# Patient Record
Sex: Female | Born: 1965 | Hispanic: Yes | Marital: Married | Smoking: Never smoker
Health system: Southern US, Community
[De-identification: ages and names within clinical notes are randomized; demographics above are authoritative.]

---

## 2012-11-28 ENCOUNTER — Encounter (HOSPITAL_COMMUNITY)
Admission: EM | Disposition: A | Discharge status: Home / Self Care | Payer: Self-pay | Source: Home / Self Care | Attending: Orthopedic Surgery

## 2012-11-28 ENCOUNTER — Emergency Department (HOSPITAL_COMMUNITY): Payer: Self-pay

## 2012-11-28 ENCOUNTER — Encounter (HOSPITAL_COMMUNITY): Payer: Self-pay | Admitting: *Deleted

## 2012-11-28 ENCOUNTER — Inpatient Hospital Stay (HOSPITAL_COMMUNITY): Payer: Self-pay | Admitting: Anesthesiology

## 2012-11-28 ENCOUNTER — Encounter (HOSPITAL_COMMUNITY): Payer: Self-pay | Admitting: Anesthesiology

## 2012-11-28 ENCOUNTER — Observation Stay (HOSPITAL_COMMUNITY)
Admission: EM | Admit: 2012-11-28 | Discharge: 2012-11-29 | Disposition: A | Discharge status: Home / Self Care | Payer: MEDICAID | Attending: Orthopedic Surgery | Admitting: Orthopedic Surgery

## 2012-11-28 DIAGNOSIS — W010XXA Fall on same level from slipping, tripping and stumbling without subsequent striking against object, initial encounter: Secondary | ICD-10-CM | POA: Insufficient documentation

## 2012-11-28 DIAGNOSIS — M25429 Effusion, unspecified elbow: Secondary | ICD-10-CM | POA: Insufficient documentation

## 2012-11-28 DIAGNOSIS — F329 Major depressive disorder, single episode, unspecified: Secondary | ICD-10-CM

## 2012-11-28 DIAGNOSIS — F10920 Alcohol use, unspecified with intoxication, uncomplicated: Secondary | ICD-10-CM

## 2012-11-28 DIAGNOSIS — S52133A Displaced fracture of neck of unspecified radius, initial encounter for closed fracture: Secondary | ICD-10-CM | POA: Insufficient documentation

## 2012-11-28 DIAGNOSIS — S42402A Unspecified fracture of lower end of left humerus, initial encounter for closed fracture: Secondary | ICD-10-CM

## 2012-11-28 DIAGNOSIS — F419 Anxiety disorder, unspecified: Secondary | ICD-10-CM

## 2012-11-28 DIAGNOSIS — F1010 Alcohol abuse, uncomplicated: Secondary | ICD-10-CM | POA: Insufficient documentation

## 2012-11-28 DIAGNOSIS — S52043A Displaced fracture of coronoid process of unspecified ulna, initial encounter for closed fracture: Secondary | ICD-10-CM | POA: Insufficient documentation

## 2012-11-28 DIAGNOSIS — R110 Nausea: Secondary | ICD-10-CM | POA: Insufficient documentation

## 2012-11-28 DIAGNOSIS — S53126A Posterior dislocation of unspecified ulnohumeral joint, initial encounter: Principal | ICD-10-CM | POA: Insufficient documentation

## 2012-11-28 HISTORY — PX: TOTAL ELBOW ARTHROPLASTY: SHX812

## 2012-11-28 LAB — CBC WITH DIFFERENTIAL/PLATELET
Basophils Relative: 0 % (ref 0–1)
Eosinophils Absolute: 0 10*3/uL (ref 0.0–0.7)
Eosinophils Relative: 0 % (ref 0–5)
HCT: 37 % (ref 36.0–46.0)
Hemoglobin: 12.8 g/dL (ref 12.0–15.0)
Lymphs Abs: 1.3 10*3/uL (ref 0.7–4.0)
MCH: 31.3 pg (ref 26.0–34.0)
MCHC: 34.6 g/dL (ref 30.0–36.0)
MCV: 90.5 fL (ref 78.0–100.0)
Monocytes Absolute: 0.3 10*3/uL (ref 0.1–1.0)
Monocytes Relative: 3 % (ref 3–12)
Neutrophils Relative %: 82 % — ABNORMAL HIGH (ref 43–77)
RBC: 4.09 MIL/uL (ref 3.87–5.11)

## 2012-11-28 LAB — BASIC METABOLIC PANEL
BUN: 10 mg/dL (ref 6–23)
Creatinine, Ser: 0.43 mg/dL — ABNORMAL LOW (ref 0.50–1.10)
GFR calc non Af Amer: >90 mL/min (ref >90)
Glucose, Bld: 125 mg/dL — ABNORMAL HIGH (ref 70–99)
Potassium: 3.6 mEq/L (ref 3.5–5.1)

## 2012-11-28 SURGERY — ARTHROPLASTY, ELBOW, TOTAL
Anesthesia: General | Site: Elbow | Laterality: Left | Wound class: Clean

## 2012-11-28 MED ORDER — KETAMINE HCL 10 MG/ML IJ SOLN
INTRAMUSCULAR | Status: DC | PRN
  Administered 2012-11-28: 20 mg via INTRAVENOUS

## 2012-11-28 MED ORDER — ACETAMINOPHEN 10 MG/ML IV SOLN
INTRAVENOUS | Status: AC
  Filled 2012-11-28: qty 100

## 2012-11-28 MED ORDER — ONDANSETRON HCL 4 MG/2ML IJ SOLN
INTRAMUSCULAR | Status: DC | PRN
  Administered 2012-11-28: 4 mg via INTRAVENOUS

## 2012-11-28 MED ORDER — MIDAZOLAM HCL 5 MG/5ML IJ SOLN
INTRAMUSCULAR | Status: DC | PRN
  Administered 2012-11-28: 2 mg via INTRAVENOUS

## 2012-11-28 MED ORDER — DEXAMETHASONE SODIUM PHOSPHATE 10 MG/ML IJ SOLN
INTRAMUSCULAR | Status: DC | PRN
  Administered 2012-11-28: 10 mg via INTRAVENOUS

## 2012-11-28 MED ORDER — BACITRACIN ZINC 500 UNIT/GM EX OINT
TOPICAL_OINTMENT | CUTANEOUS | Status: AC
  Filled 2012-11-28: qty 15

## 2012-11-28 MED ORDER — ONDANSETRON HCL 4 MG PO TABS: 4.0000 mg | ORAL_TABLET | Freq: Four times a day (QID) | ORAL | Status: DC | PRN

## 2012-11-28 MED ORDER — CEFAZOLIN SODIUM-DEXTROSE 2-3 GM-% IV SOLR
INTRAVENOUS | Status: DC | PRN
  Administered 2012-11-28: 2 g via INTRAVENOUS

## 2012-11-28 MED ORDER — FENTANYL CITRATE 0.05 MG/ML IJ SOLN
50.0000 ug | Freq: Once | INTRAMUSCULAR | Status: AC
Start: 2012-11-28 — End: 2012-11-28
  Administered 2012-11-28: 50 ug via INTRAVENOUS
  Filled 2012-11-28: qty 2

## 2012-11-28 MED ORDER — ROCURONIUM BROMIDE 100 MG/10ML IV SOLN
INTRAVENOUS | Status: DC | PRN
  Administered 2012-11-28: 30 mg via INTRAVENOUS

## 2012-11-28 MED ORDER — LACTATED RINGERS IV SOLN
INTRAVENOUS | Status: DC
  Administered 2012-11-28 – 2012-11-29 (×3): via INTRAVENOUS

## 2012-11-28 MED ORDER — SODIUM CHLORIDE 0.9 % IR SOLN
Status: DC | PRN
  Administered 2012-11-28: 10:00:00

## 2012-11-28 MED ORDER — ACETAMINOPHEN 10 MG/ML IV SOLN
INTRAVENOUS | Status: DC | PRN
  Administered 2012-11-28: 1000 mg via INTRAVENOUS

## 2012-11-28 MED ORDER — CEFAZOLIN SODIUM 1-5 GM-% IV SOLN
1.0000 g | Freq: Three times a day (TID) | INTRAVENOUS | Status: DC
  Administered 2012-11-28 – 2012-11-29 (×3): 1 g via INTRAVENOUS
  Filled 2012-11-28 (×4): qty 50

## 2012-11-28 MED ORDER — BUPIVACAINE HCL (PF) 0.5 % IJ SOLN
INTRAMUSCULAR | Status: DC | PRN
  Administered 2012-11-28: 20 mL

## 2012-11-28 MED ORDER — SUCCINYLCHOLINE CHLORIDE 20 MG/ML IJ SOLN
INTRAMUSCULAR | Status: DC | PRN
  Administered 2012-11-28: 100 mg via INTRAVENOUS

## 2012-11-28 MED ORDER — PROPOFOL 10 MG/ML IV BOLUS
INTRAVENOUS | Status: DC | PRN
  Administered 2012-11-28: 150 mg via INTRAVENOUS

## 2012-11-28 MED ORDER — PHENYLEPHRINE HCL 10 MG/ML IJ SOLN
INTRAMUSCULAR | Status: DC | PRN
  Administered 2012-11-28: 80 ug via INTRAVENOUS

## 2012-11-28 MED ORDER — FAMOTIDINE 20 MG PO TABS
20.0000 mg | ORAL_TABLET | Freq: Two times a day (BID) | ORAL | Status: DC | PRN
  Filled 2012-11-28: qty 1

## 2012-11-28 MED ORDER — PROMETHAZINE HCL 25 MG RE SUPP: 12.5000 mg | Freq: Four times a day (QID) | RECTAL | Status: DC | PRN

## 2012-11-28 MED ORDER — ALPRAZOLAM 0.5 MG PO TABS: 0.5000 mg | ORAL_TABLET | Freq: Four times a day (QID) | ORAL | Status: DC | PRN

## 2012-11-28 MED ORDER — 0.9 % SODIUM CHLORIDE (POUR BTL) OPTIME
TOPICAL | Status: DC | PRN
  Administered 2012-11-28: 2000 mL

## 2012-11-28 MED ORDER — MORPHINE SULFATE 2 MG/ML IJ SOLN
1.0000 mg | INTRAMUSCULAR | Status: DC | PRN
  Administered 2012-11-28 – 2012-11-29 (×2): 1 mg via INTRAVENOUS
  Filled 2012-11-28 (×2): qty 1

## 2012-11-28 MED ORDER — LIDOCAINE HCL (CARDIAC) 20 MG/ML IV SOLN
INTRAVENOUS | Status: DC | PRN
  Administered 2012-11-28: 100 mg via INTRAVENOUS

## 2012-11-28 MED ORDER — HYDROMORPHONE HCL PF 1 MG/ML IJ SOLN: 0.2500 mg | INTRAMUSCULAR | Status: DC | PRN

## 2012-11-28 MED ORDER — VITAMIN C 500 MG PO TABS
1000.0000 mg | ORAL_TABLET | Freq: Every day | ORAL | Status: DC
  Administered 2012-11-28 – 2012-11-29 (×2): 1000 mg via ORAL
  Filled 2012-11-28 (×2): qty 2

## 2012-11-28 MED ORDER — OXYCODONE HCL 5 MG PO TABS
5.0000 mg | ORAL_TABLET | ORAL | Status: DC | PRN
  Filled 2012-11-28: qty 1

## 2012-11-28 MED ORDER — BUPIVACAINE HCL (PF) 0.5 % IJ SOLN
INTRAMUSCULAR | Status: AC
Start: 2012-11-28 — End: 2012-11-28
  Filled 2012-11-28: qty 30

## 2012-11-28 MED ORDER — ETOMIDATE 2 MG/ML IV SOLN
20.0000 mg | Freq: Once | INTRAVENOUS | Status: AC
  Administered 2012-11-28: 10 mg via INTRAVENOUS
  Filled 2012-11-28: qty 10

## 2012-11-28 MED ORDER — HYDROMORPHONE HCL PF 1 MG/ML IJ SOLN
INTRAMUSCULAR | Status: DC | PRN
  Administered 2012-11-28: 1 mg via INTRAVENOUS

## 2012-11-28 MED ORDER — FENTANYL CITRATE 0.05 MG/ML IJ SOLN
INTRAMUSCULAR | Status: DC | PRN
  Administered 2012-11-28 (×5): 50 ug via INTRAVENOUS

## 2012-11-28 MED ORDER — ONDANSETRON HCL 4 MG/2ML IJ SOLN
4.0000 mg | Freq: Four times a day (QID) | INTRAMUSCULAR | Status: DC | PRN
  Administered 2012-11-28: 4 mg via INTRAVENOUS
  Filled 2012-11-28: qty 2

## 2012-11-28 MED ORDER — MORPHINE SULFATE 2 MG/ML IJ SOLN
2.0000 mg | INTRAMUSCULAR | Status: DC | PRN
  Administered 2012-11-28: 2 mg via INTRAVENOUS
  Filled 2012-11-28: qty 1

## 2012-11-28 MED ORDER — CEFAZOLIN SODIUM-DEXTROSE 2-3 GM-% IV SOLR
INTRAVENOUS | Status: AC
  Filled 2012-11-28: qty 50

## 2012-11-28 MED ORDER — SODIUM CHLORIDE 0.9 % IV SOLN
Freq: Once | INTRAVENOUS | Status: AC
  Administered 2012-11-28: 04:00:00 via INTRAVENOUS

## 2012-11-28 MED ORDER — NEOSTIGMINE METHYLSULFATE 1 MG/ML IJ SOLN
INTRAMUSCULAR | Status: DC | PRN
  Administered 2012-11-28: 5 mg via INTRAVENOUS

## 2012-11-28 MED ORDER — DOCUSATE SODIUM 100 MG PO CAPS
100.0000 mg | ORAL_CAPSULE | Freq: Two times a day (BID) | ORAL | Status: DC
  Administered 2012-11-28 – 2012-11-29 (×2): 100 mg via ORAL
  Filled 2012-11-28 (×3): qty 1

## 2012-11-28 MED ORDER — GLYCOPYRROLATE 0.2 MG/ML IJ SOLN
INTRAMUSCULAR | Status: DC | PRN
  Administered 2012-11-28: .8 mg via INTRAVENOUS

## 2012-11-28 MED ORDER — FENTANYL CITRATE 0.05 MG/ML IJ SOLN
50.0000 ug | Freq: Once | INTRAMUSCULAR | Status: AC
  Administered 2012-11-28: 50 ug via INTRAVENOUS

## 2012-11-28 MED ORDER — LACTATED RINGERS IV SOLN
INTRAVENOUS | Status: DC
  Administered 2012-11-28: 09:00:00 via INTRAVENOUS

## 2012-11-28 SURGICAL SUPPLY — 42 items
ANCHOR JUGGERKNOT SHT 1.4 SOFT (Anchor) ×4 IMPLANT
BAG ZIPLOCK 12X15 (MISCELLANEOUS) ×2 IMPLANT
BANDAGE ELASTIC 4 VELCRO ST LF (GAUZE/BANDAGES/DRESSINGS) ×2 IMPLANT
BLADE OSCILLATING/SAGITTAL (BLADE) ×1
BLADE SAW SAG 73X25 THK (BLADE) ×1
BLADE SAW SGTL 73X25 THK (BLADE) ×1 IMPLANT
BLADE SW THK.38XMED LNG THN (BLADE) ×1 IMPLANT
BNDG ESMARK 4X9 LF (GAUZE/BANDAGES/DRESSINGS) ×2 IMPLANT
CLOTH BEACON ORANGE TIMEOUT ST (SAFETY) ×2 IMPLANT
CUFF TOURN SGL QUICK 18 (TOURNIQUET CUFF) ×2 IMPLANT
DRAPE U-SHAPE 47X51 STRL (DRAPES) ×4 IMPLANT
DRSG EMULSION OIL 3X16 NADH (GAUZE/BANDAGES/DRESSINGS) ×2 IMPLANT
DURAPREP 26ML APPLICATOR (WOUND CARE) IMPLANT
ELECT REM PT RETURN 9FT ADLT (ELECTROSURGICAL) ×2
ELECTRODE REM PT RTRN 9FT ADLT (ELECTROSURGICAL) ×1 IMPLANT
GAUZE XEROFORM 5X9 LF (GAUZE/BANDAGES/DRESSINGS) ×2 IMPLANT
GLOVE BIO SURGEON STRL SZ8 (GLOVE) ×2 IMPLANT
GOWN STRL REIN XL XLG (GOWN DISPOSABLE) ×2 IMPLANT
HEAD RADIAL 10X20 (Head) ×2 IMPLANT
MANIFOLD NEPTUNE II (INSTRUMENTS) ×2 IMPLANT
NS IRRIG 1000ML POUR BTL (IV SOLUTION) ×4 IMPLANT
PACK SHOULDER CUSTOM OPM052 (CUSTOM PROCEDURE TRAY) ×2 IMPLANT
PAD CAST 4YDX4 CTTN HI CHSV (CAST SUPPLIES) ×1 IMPLANT
PADDING CAST COTTON 4X4 STRL (CAST SUPPLIES) ×1
PASSER SUT SWANSON 36MM LOOP (INSTRUMENTS) ×2 IMPLANT
POSITIONER SURGICAL ARM (MISCELLANEOUS) ×2 IMPLANT
SPONGE GAUZE 4X4 12PLY (GAUZE/BANDAGES/DRESSINGS) ×2 IMPLANT
SPONGE LAP 4X18 X RAY DECT (DISPOSABLE) IMPLANT
STEM IMPLANT W SCREW (Stem) ×2 IMPLANT
SUT FIBERWIRE #2 38 T-5 BLUE (SUTURE)
SUT PROLENE 4 0 PS 2 18 (SUTURE) ×2 IMPLANT
SUT VIC AB 0 CT1 27 (SUTURE) ×1
SUT VIC AB 0 CT1 27XBRD ANTBC (SUTURE) ×1 IMPLANT
SUT VIC AB 1 CT1 27 (SUTURE) ×1
SUT VIC AB 1 CT1 27XBRD ANTBC (SUTURE) ×1 IMPLANT
SUT VIC AB 2-0 CT2 27 (SUTURE) ×2 IMPLANT
SUT VIC AB 3-0 PS2 18 (SUTURE) ×1
SUT VIC AB 3-0 PS2 18XBRD (SUTURE) ×1 IMPLANT
SUTURE FIBERWR #2 38 T-5 BLUE (SUTURE) IMPLANT
TOWEL OR 17X26 10 PK STRL BLUE (TOWEL DISPOSABLE) ×2 IMPLANT
TOWER CARTRIDGE SMART MIX (DISPOSABLE) IMPLANT
WATER STERILE IRR 1500ML POUR (IV SOLUTION) IMPLANT

## 2012-11-28 NOTE — ED Notes (Signed)
Pt slipped and fell on left side, pain on left arm particularly on left elbow, able to move shower but holding on the wrist and elbow. Limited ROM, redness and swelling noted on left elbow. Xray ordered. Pt stable at the moment.

## 2012-11-28 NOTE — H&P (Signed)
Jacqueline Lawson is an 47 y.o. female.   Chief Complaint: Fracture dislocation left elbow HPI: Pleasant Hispanic female with a fracture dislocation to her elbow. She was preliminarily seen and reduced by emergency staff Dr. Freida Busman. Subsequent to this x-rays do show the fracture and continued subluxation of the joint to a certain extent. She has a lateral ulna collateral ligament injury with associated displaced radial head fracture including and neck. Unfortunately this is a highly unstable situation. The patient I discussed these issues at length. She has been n.p.o. She notes no other complaints. She is here with her husband. She fell on an outstretched extremity due to the slippery floor she states. The patient is quite pleasant. She denies neck back chest or nominal pain. She is alert and oriented x4.  History reviewed. No pertinent past medical history.  History reviewed. No pertinent past surgical history.  No family history on file. Social History:  reports that she has never smoked. She does not have any smokeless tobacco history on file. She reports that she does not drink alcohol or use illicit drugs.  Allergies: No Known Allergies   (Not in a hospital admission)  Results for orders placed during the hospital encounter of 11/28/12 (from the past 48 hour(s))  CBC WITH DIFFERENTIAL     Status: Abnormal   Collection Time    11/28/12  6:37 AM      Result Value Range   WBC 8.5  4.0 - 10.5 K/uL   RBC 4.09  3.87 - 5.11 MIL/uL   Hemoglobin 12.8  12.0 - 15.0 g/dL   HCT 16.1  09.6 - 04.5 %   MCV 90.5  78.0 - 100.0 fL   MCH 31.3  26.0 - 34.0 pg   MCHC 34.6  30.0 - 36.0 g/dL   RDW 40.9  81.1 - 91.4 %   Platelets 223  150 - 400 K/uL   Neutrophils Relative % 82 (*) 43 - 77 %   Neutro Abs 7.0  1.7 - 7.7 K/uL   Lymphocytes Relative 15  12 - 46 %   Lymphs Abs 1.3  0.7 - 4.0 K/uL   Monocytes Relative 3  3 - 12 %   Monocytes Absolute 0.3  0.1 - 1.0 K/uL   Eosinophils Relative 0   0 - 5 %   Eosinophils Absolute 0.0  0.0 - 0.7 K/uL   Basophils Relative 0  0 - 1 %   Basophils Absolute 0.0  0.0 - 0.1 K/uL  BASIC METABOLIC PANEL     Status: Abnormal   Collection Time    11/28/12  6:37 AM      Result Value Range   Sodium 136  135 - 145 mEq/L   Potassium 3.6  3.5 - 5.1 mEq/L   Chloride 104  96 - 112 mEq/L   CO2 23  19 - 32 mEq/L   Glucose, Bld 125 (*) 70 - 99 mg/dL   BUN 10  6 - 23 mg/dL   Creatinine, Ser 7.82 (*) 0.50 - 1.10 mg/dL   Calcium 8.8  8.4 - 95.6 mg/dL   GFR calc non Af Amer >90  >90 mL/min   GFR calc Af Amer >90  >90 mL/min   Comment:            The eGFR has been calculated     using the CKD EPI equation.     This calculation has not been     validated in all clinical  situations.     eGFR's persistently     <90 mL/min signify     possible Chronic Kidney Disease.   Dg Chest 2 View  11/28/2012   *RADIOLOGY REPORT*  Clinical Data: Preoperative evaluation for elbow surgery  CHEST - 2 VIEW  Comparison: None  Findings: Normal heart size, mediastinal contours, and pulmonary vascularity. Lungs clear. No pleural effusion or pneumothorax. No acute osseous findings.  IMPRESSION: No acute abnormalities.   Original Report Authenticated By: Ulyses Southward, M.D.   Dg Elbow 2 Views Left  11/28/2012   *RADIOLOGY REPORT*  Clinical Data:  Elbow dislocation, post reduction  LEFT ELBOW - 2 VIEW  Comparison: 11/28/2012  Findings: Trochlear joint dislocation has been reduced with normal alignment of ulna and humerus now identified. Displaced radial neck fracture is now identified. Radial head fragment is dislocated from the radiocapitellar joint. Bone fragments are seen anterior to the elbow joint. Large elbow joint effusion is seen. Regional soft tissue swelling.  IMPRESSION: Reduction of previously identified ulnar dislocation with normal alignment of the trochlear joint now identified. Displaced radial neck fracture with dislocation of the radial head from the  radiocapitellar joint. This radial neck fracture is not well visualized on the earlier radiographs.   Original Report Authenticated By: Ulyses Southward, M.D.   Dg Elbow Complete Left  11/28/2012   *RADIOLOGY REPORT*  Clinical Data: Left elbow pain and limited mobility post fall  LEFT ELBOW - COMPLETE 3+ VIEW  Comparison: None  Findings: Posterior/dorsal dislocation of the left elbow. Multiple small fracture fragments are seen at the trochlea, of uncertain origin. Associated soft tissue swelling and deformity as well as questionable elbow joint effusion.  IMPRESSION: Posterior/dorsal elbow dislocation with a few linear fracture fragments at the trochlea of uncertain origin.   Original Report Authenticated By: Ulyses Southward, M.D.    Review of Systems  Constitutional: Negative.   HENT: Negative.   Eyes: Negative.   Cardiovascular: Negative.   Gastrointestinal: Negative.   Genitourinary: Negative.   Musculoskeletal:       Fracture dislocation left elbow see physical  Skin: Negative.   Neurological: Negative.   Endo/Heme/Allergies: Negative.   Psychiatric/Behavioral: Negative.     Blood pressure 120/87, pulse 109, temperature 98.2 F (36.8 C), temperature source Oral, resp. rate 15, SpO2 100.00%. Physical Exam patient has a left elbow is swollen she has a fracture dislocation. She notes no numbness in the hand but states that the arm in general is sore her extensor pollicis longus fires well. The patient has no evidence of infection or compartment syndrome. Shoulder and wrist are nontender. She notes no instability symptoms in her wrist however examination for [place] injury is difficult due to her pain . She does not have any open wounds at present time.  Her husband is at her bedside they're both awake alert and oriented. The patient notes no neck back chest or abdominal pain.  [name].The patient is alert and oriented in no acute distress the patient complains of pain in the affected upper  extremity.  The patient is noted to have a normal HEENT exam.  Lung fields show equal chest expansion and no shortness of breath  abdomen exam is nontender without distention.  Lower extremity examination does not show any fracture dislocation or blood clot symptoms.  Pelvis is stable neck and back are stable and nontender  Assessment/Plan Fracture dislocation left elbow. Given the displacement of the radial neck/head and its instability I would recommend ORIF versus radial head arthroplasty with  repair reconstruction of the lateral ulna humeral ligament as necessary. I discussed with the patient risk and benefits. I discussed with them likely stiffness as well as prolonged recovery. Typically our goal is to provide a stable elbow with prevention of further arthritis developing from a traumatic injury such as this. The patient and her family understand this and risk and benefits and desires to proceed with the above mentioned operative intervention as soon as possible. I discussed with the patient relevant issues in terms of her extremity predicament and in terms of her postop recovery process.  I discussed with the emergency room staff my findings and plans. We will proceed to the operative arena as soon as possible.  [name].We are planning surgery for your upper extremity. The risk and benefits of surgery include risk of bleeding infection anesthesia damage to normal structures and failure of the surgery to accomplish its intended goals of relieving symptoms and restoring function with this in mind we'll going to proceed. I have specifically discussed with the patient the pre-and postoperative regime and the does and don'ts and risk and benefits in great detail. Risk and benefits of surgery also include risk of dystrophy chronic nerve pain failure of the healing process to go onto completion and other inherent risks of surgery The relavent the pathophysiology of the disease/injury process, as well as the  alternatives for treatment and postoperative course of action has been discussed in great detail with the patient who desires to proceed.  We will do everything in our power to help you (the patient) restore function to the upper extremity. Is a pleasure to see this patient today.   Karen Chafe 11/28/2012, 7:20 AM

## 2012-11-28 NOTE — ED Notes (Signed)
Pt moaning in pain,  Verbal order by Dondra Spry NP  To give Fentanyl IV

## 2012-11-28 NOTE — Transfer of Care (Signed)
Immediate Anesthesia Transfer of Care Note  Patient: Jacqueline Lawson  Procedure(s) Performed: Procedure(s) (LRB): radial head excision open treatment coronoid fracture ulna left elbow (Left) lateral ulna collateral lateral ligament repair (Left)  Patient Location: PACU  Anesthesia Type: General  Level of Consciousness: sedated, patient cooperative and responds to stimulaton  Airway & Oxygen Therapy: Patient Spontanous Breathing and Patient connected to face mask oxgen  Post-op Assessment: Report given to PACU RN and Post -op Vital signs reviewed and stable  Post vital signs: Reviewed and stable  Complications: No apparent anesthesia complications

## 2012-11-28 NOTE — Progress Notes (Signed)
Pt vitals stable post op. Pt states pain is a 5/10 but declines pain medication at this time. Will continue to monitor, family at bedside. Pt educated to move fingers often. Left fingers warm to touch.

## 2012-11-28 NOTE — ED Notes (Signed)
Pt's daughter reports pt slipped on a wet floor, attempted to catch her fall with her L hand.  Reports L elbow pain.  Obvious deformity noted.  Denies hitting her head

## 2012-11-28 NOTE — Anesthesia Postprocedure Evaluation (Addendum)
  Anesthesia Post-op Note  Patient: Jacqueline Lawson  Procedure(s) Performed: Procedure(s) (LRB): radial head excision open treatment coronoid fracture ulna left elbow (Left) lateral ulna collateral lateral ligament repair (Left)  Patient Location: PACU  Anesthesia Type: General  Level of Consciousness: awake and alert   Airway and Oxygen Therapy: Patient Spontanous Breathing  Post-op Pain: mild  Post-op Assessment: Post-op Vital signs reviewed, Patient's Cardiovascular Status Stable, Respiratory Function Stable, Patent Airway and No signs of Nausea or vomiting  Last Vitals:  Filed Vitals:   11/28/12 1315  BP: 118/79  Pulse: 92  Temp: 36.5 C  Resp: 16    Post-op Vital Signs: stable   Complications: No apparent anesthesia complications

## 2012-11-28 NOTE — Anesthesia Preprocedure Evaluation (Addendum)

## 2012-11-28 NOTE — ED Notes (Signed)
tripp

## 2012-11-28 NOTE — Op Note (Signed)
See dictation # [id number] Dominica Severin M.D.

## 2012-11-28 NOTE — Preoperative (Signed)
Beta Blockers   Reason not to administer Beta Blockers:Not Applicable 

## 2012-11-28 NOTE — Op Note (Signed)
NAMEONI, DIETZMAN ACCOUNT NO.:  [id number]  MEDICAL RECORD NO.:  [id number]  LOCATION:  [facility]                         FACILITY:  [facility]  PHYSICIAN:  Dionne Ano. Ruqayya Ventress, M.D.DATE OF BIRTH:  [birth year]  DATE OF PROCEDURE:  11/28/2012 DATE OF DISCHARGE:                              OPERATIVE REPORT   PREOPERATIVE DIAGNOSIS:  Terrible triad fracture, left elbow with fracture dislocation and complete radial head disassociation from the radial shaft.  POSTOPERATIVE DIAGNOSIS:  Terrible triad fracture, left elbow with fracture dislocation and complete radial head disassociation from the radial shaft.  PROCEDURE: 1. Reduction of unstable left elbow. 2. Radial head excision and radial head arthroplasty utilizing a     Biomet ExploR implant 6 mm x 24 mm stem and a 10 x 20 mm head made     of Chromium Cobalt. 3. Open treatment coronoid fracture about the ulna. 4. Removal of loose body, left elbow joint with synovectomy. 5. Extensive lateral ulnar collateral ligament reconstruction with     Biomet JuggerKnot.  SURGEON:  Dionne Ano. Amanda Pea, M.D.  ASSISTANT:  None.  COMPLICATIONS:  None.  ANESTHESIA:  General.  TOURNIQUET TIME:  1 hour.  DRAINS:  None.  INDICATIONS:  This patient is a pleasant 47 year old female who fell down and sustained a fracture dislocation to her elbow.  She preliminarily had reduction in the ER, however, this was noted to be very unstable with fracture dislocation findings.  I counseled she and her family in regards to risks and benefits of surgery including risk of infection, bleeding, anesthesia, damage to normal structures, and failure of surgery to accomplish its intended goals of relieving symptoms and restoring function.  With this in mind, she desires to proceed.  All questions have been encouraged and answered preoperatively.  OPERATIVE PROCEDURE:  The patient was seen by myself and anesthesia, taken to operative suite, time-out  called.  General anesthesia was induced.  Body parts well padded.  Pulse was checked distally and following this, the arm was prepped and draped in usual sterile fashion, Betadine scrub and paint.  Once this was complete, and preoperative Ancef (2 g) was given, we called a final time-out with pre and postop checklist being secured.  At this juncture, I inflated the tourniquet to 250 mmHg, dissected down, lateral approach to the elbow was accomplished keeping the arm pronated and the elbow at 90 degrees.  I performed a closed reduction in his elbow, I repeated a dislocation event.  Thus the elbow was relocated.  Following this, I dissected down sharply with knife blade to the fascia.  Fascia was identified and planes developed. Following this, I entered the Kocher interval just above the anconeus ECU interval.  I identified the radial head which was completely in the soft tissue, devoid of any meaningful blood flow.  I removed it, incised it at this point as it was very apparent she would require radial head arthroplasty as opposed to an ORIF.  At this juncture, I then dissected very carefully, keeping the arm pronated and a path for the radial nerve in my mind.  I did not dissect the radial nerve.  The patient had the coronoid fracture treated in an open fashion with debridement and setting technique.  Then, I performed arthrotomy  and synovectomy posteriorly.  This was mainly a synovectomy with removal of hematoma and bloody tissue as well as loose bodies secondary to fragmentation from the lateral ulnar collateral ligament injury.  The patient had severe lateral ulnar collateral ligament tearing.  The patient basically sheared off the posterolateral corner.  At this juncture, I then irrigated copiously with 2 L of saline and then prepared the radial shaft.  A size 6 mm trial was used.  Following stem preparation with broach, I then performed very careful and cautious irrigation  followed by a trial of a 10 x 20 mm head.  The patient tolerated this well.  There were no complicating features. I placed the patient through live fluoro, and full range of motion was noted.  There was no evidence of overstuffing the joint and all looked quite well.  This cut about the radial neck looked excellent.  I did not take down the annular ligament, and I did prepare this with oscillating saw and file prior to trial implant placement.  Once trial implant was placed, I then removed the implants and irrigated I the entire elbow in an aggressive fashion with saline and antibiotic irrigant, an additional 2 L.  Following this, the patient then had placement of the final implant as described.  A Biomet 6 x 24 mm stem was placed followed by placement of the ExploR Biomet 10 x 20 mm implant head.  A screw for the stem was used and I was very careful to avoid injury to the cartilaginous tissue. Coronoid fracture looked excellent.  At this time, I then placed 2 JuggerKnots in the point of isometry region and then repaired the lateral ulnar collateral ligament with the suture anchor technique.  The patient had full range of motion.  Final copy x-rays were taken under live fluoro, and was spot shot showing excellent reduction.  No evidence of overstuffing the tissue and all looked quite well.  I then irrigated additionally and closed the fascia interval and then performed closure with the tourniquet down of course with 3-0 Vicryl and 4-0 Prolene.  The patient tolerated this well.  There were no complicating features.  Once this was done, the compartments were checked, it looked excellent.  She had good pulse.  No evidence of complicating issues.  She was placed in a long-arm splint and sterile dressing.  Adaptic, Xeroform, 4 x 4, Kerlix, Webril, and posterior plaster splint with derotation slab was placed.  There were no complicating features.  All sponge and instrument counts were  reported as correct.  We are going to monitor her closely, IV antibiotics, postop pain management, our general postop algorithm will be adhered to.  We will get her in therapy for well arm assisted range of motion at 2 weeks postop.  We are going to be careful to avoid the last 30 degrees of extension, the first 4 weeks and then move her harder.  She had good stability at the conclusion of the case.  These notes were discussed and all questions have been encouraged and answered.     Dionne Ano. Amanda Pea, M.D.    [facility]  D:  11/28/2012  T:  11/28/2012  Job:  [id number]

## 2012-11-28 NOTE — ED Notes (Signed)
Pt had left sling size medium placed by this writer and Dondra Spry NP

## 2012-11-28 NOTE — ED Provider Notes (Signed)
Medical screening examination/treatment/procedure(s) were conducted as a shared visit with non-physician practitioner(s) and myself.  I personally evaluated the patient during the encounter  Pts elbow dislocation reduced with traction , repeat elbow films show radial head dislocation with good olecranon alignment--neurovasc intact, will consult hand surgery  Toy Baker, MD 11/28/12 [phone]

## 2012-11-28 NOTE — ED Provider Notes (Signed)
History     CSN: [id number]  Arrival date & time 11/28/12  0153   First MD Initiated Contact with Patient 11/28/12 0325      Chief Complaint  Patient presents with  . Fall  . Extremity Laceration    (Consider location/radiation/quality/duration/timing/severity/associated sxs/prior treatment) HPI Comments: Patient slipped on a wet floor, catching herself with her outstretched left hand.  Now.  She has pain in her left elbow has full range of motion of the wrist.  Normal sensation, and cap refill distal to the injury site  Patient is a 47 y.o. female presenting with fall. The history is provided by the patient. A language interpreter was used.  Fall This is a new problem. The current episode started today. The problem occurs constantly. The problem has been unchanged. Associated symptoms include joint swelling. Pertinent negatives include no chills, fever, headaches or neck pain. The symptoms are aggravated by exertion. She has tried nothing for the symptoms. The treatment provided no relief.    History reviewed. No pertinent past medical history.  History reviewed. No pertinent past surgical history.  No family history on file.  History  Substance Use Topics  . Smoking status: Never Smoker   . Smokeless tobacco: Not on file  . Alcohol Use: No    OB History   Grav Para Term Preterm Abortions TAB SAB Ect Mult Living                  Review of Systems  Constitutional: Negative for fever and chills.  HENT: Negative for neck pain.   Musculoskeletal: Positive for joint swelling.  Skin: Positive for wound.  Neurological: Negative for dizziness and headaches.  All other systems reviewed and are negative.    Allergies  Review of patient's allergies indicates no known allergies.  Home Medications   No current outpatient prescriptions on file.  BP 120/83  Pulse 94  Temp(Src) 98.2 F (36.8 C) (Oral)  Resp 16  SpO2 96%  Physical Exam  Nursing note and vitals  reviewed. Constitutional: She is oriented to person, place, and time. She appears well-developed and well-nourished.  HENT:  Head: Normocephalic.  Eyes: Pupils are equal, round, and reactive to light.  Neck: Normal range of motion.  Cardiovascular: Normal rate and regular rhythm.   Pulmonary/Chest: Effort normal and breath sounds normal.  Musculoskeletal: She exhibits tenderness. She exhibits no edema.       Right elbow: She exhibits decreased range of motion, swelling and deformity. She exhibits no laceration.  Neurological: She is alert and oriented to person, place, and time.  Skin: Skin is warm and dry.    ED Course  Reduction of dislocation Date/Time: 11/28/2012 5:11 AM Performed by: Arman Filter Authorized by: Arman Filter Consent: Verbal consent obtained. Consent given by: patient Patient understanding: patient states understanding of the procedure being performed Patient identity confirmed: verbally with patient Time out: Immediately prior to procedure a "time out" was called to verify the correct patient, procedure, equipment, support staff and site/side marked as required. Local anesthesia used: no Patient sedated: yes Sedation type: moderate (conscious) sedation Sedatives: etomidate Analgesia: fentanyl Vitals: Vital signs were monitored during sedation. Patient tolerance: Patient tolerated the procedure well with no immediate complications.   (including critical care time)  Labs Reviewed  CBC WITH DIFFERENTIAL - Abnormal; Notable for the following:    Neutrophils Relative % 82 (*)    All other components within normal limits  BASIC METABOLIC PANEL - Abnormal; Notable for the  following:    Glucose, Bld 125 (*)    Creatinine, Ser 0.43 (*)    All other components within normal limits  GLUCOSE, CAPILLARY - Abnormal; Notable for the following:    Glucose-Capillary 127 (*)    All other components within normal limits   Dg Chest 2 View  11/28/2012   *RADIOLOGY  REPORT*  Clinical Data: Preoperative evaluation for elbow surgery  CHEST - 2 VIEW  Comparison: None  Findings: Normal heart size, mediastinal contours, and pulmonary vascularity. Lungs clear. No pleural effusion or pneumothorax. No acute osseous findings.  IMPRESSION: No acute abnormalities.   Original Report Authenticated By: Ulyses Southward, M.D.   Dg Elbow 2 Views Left  11/28/2012   *RADIOLOGY REPORT*  Clinical Data:  Elbow dislocation, post reduction  LEFT ELBOW - 2 VIEW  Comparison: 11/28/2012  Findings: Trochlear joint dislocation has been reduced with normal alignment of ulna and humerus now identified. Displaced radial neck fracture is now identified. Radial head fragment is dislocated from the radiocapitellar joint. Bone fragments are seen anterior to the elbow joint. Large elbow joint effusion is seen. Regional soft tissue swelling.  IMPRESSION: Reduction of previously identified ulnar dislocation with normal alignment of the trochlear joint now identified. Displaced radial neck fracture with dislocation of the radial head from the radiocapitellar joint. This radial neck fracture is not well visualized on the earlier radiographs.   Original Report Authenticated By: Ulyses Southward, M.D.   Dg Elbow Complete Left  11/28/2012   *RADIOLOGY REPORT*  Clinical Data: Left elbow pain and limited mobility post fall  LEFT ELBOW - COMPLETE 3+ VIEW  Comparison: None  Findings: Posterior/dorsal dislocation of the left elbow. Multiple small fracture fragments are seen at the trochlea, of uncertain origin. Associated soft tissue swelling and deformity as well as questionable elbow joint effusion.  IMPRESSION: Posterior/dorsal elbow dislocation with a few linear fracture fragments at the trochlea of uncertain origin.   Original Report Authenticated By: Ulyses Southward, M.D.     1. Alcohol intoxication, uncomplicated   2. Anxiety   3. Depression       MDM          Arman Filter, NP 12/05/12 2055

## 2012-11-29 MED ORDER — METHOCARBAMOL 500 MG PO TABS: 500.0000 mg | ORAL_TABLET | Freq: Three times a day (TID) | ORAL | Status: AC

## 2012-11-29 MED ORDER — OXYCODONE HCL 5 MG PO TABS: 5.0000 mg | ORAL_TABLET | ORAL | Status: AC | PRN

## 2012-11-29 MED ORDER — CEPHALEXIN 500 MG PO CAPS: 500.0000 mg | ORAL_CAPSULE | Freq: Four times a day (QID) | ORAL | Status: AC

## 2012-11-29 MED ORDER — ONDANSETRON HCL 4 MG PO TABS: 4.0000 mg | ORAL_TABLET | Freq: Four times a day (QID) | ORAL | Status: AC | PRN

## 2012-11-29 NOTE — Discharge Summary (Signed)
Physician Discharge Summary  Patient ID: Jacqueline Lawson MRN: [id number] DOB/AGE: [birth year] 47 y.o.  Admit date: 11/28/2012 Discharge date: 11/29/2012 Admission Diagnoses: Terrible Triad Fracture Left Elbow(left elbow fracture dislocation) Alcohol intoxication  Discharge Diagnoses: improved, S/P open reduction and radial head arthroplasty with ligamentous reconstruction left elbow   Discharged Condition: Stable  Hospital Course: The patient is a pleasant 47 year old female who presented to the emergency room on 11/29/1998 for evaluation of her left upper extremity after a fall she sustained in the early morning hours. The patient was noted to have a fracture dislocation of the left elbow, assistant with a terrible triad type fracture presentation. The patient was seen and evaluated by hand and upper extremity surgery and recommendation for operative intervention was implemented. The patient underwent open reduction, radial head arthroplasty as well as repair of a lateral ulnar collateral ligament. She tolerated the procedure well please see operative report for full details. On postoperative day #1 the patient was awake she was doing well her pain was controlled with a combination of by mouth medications as well as IV pain medications. The patient is receiving IV antibiotics prophylactically. She was tolerating a regular diet, she had 1 episode of nausea however this did resolve. She was voiding without difficulty. Postoperative day #2 her nausea was greatly improved her pain was well controlled. She was tolerating a regular diet without difficulties and voiding without difficulties. She was noted to have flatus. She had no complaints. The patient was comfortable and was eager for discharge. I should note she lives in the [place] area and we have discussed at great length today the importance of followup care with [place]. Have one of her at length with her discharge instructions, to include  keeping her splint clean and intact, do not remove the splint, elevating the upper extremity frequently and moving her fingers. She should wear sling when she is ambulatory. We will recheck her in 10 days. Should note her postoperative course was uncomplicated on day of discharge her vital signs were stable, she was afebrile.  Consults: None  Significant Diagnostic Studies: None  Treatments: 1. Reduction of unstable left elbow.  2. Radial head excision and radial head arthroplasty utilizing a  Biomet ExploR implant 6 mm x 24 mm stem and a 10 x 20 mm head made  of Chromium Cobalt.  3. Open treatment coronoid fracture about the ulna.  4. Removal of loose body, left elbow joint with synovectomy.  5. Extensive lateral ulnar collateral ligament reconstruction with  Biomet JuggerKnot.   Discharge Exam: Blood pressure 112/73, pulse 77, temperature 98.5 F (36.9 C), temperature source Oral, resp. rate 18, SpO2 96.00%. Jacqueline Lawson.Patient presents for evaluation and treatment of the of their upper extremity predicament. The patient denies neck back chest or of abdominal pain. The patient notes that they have no lower extremity problems. The patient from primarily complains of the upper extremity pain noted. Examination of the left upper extremity reveals that her splint is clean and intact. She has minimal to no edema about the digits. Radial, ulnar, median nerve distribution are intact. She has excellent active range of motion to the fingers passive range of motion is nontender. There no signs of infection or ascending cellulitis.  Disposition: Final discharge disposition not confirmed  Discharge Orders   Future Orders Complete By Expires     Call MD / Call 911  As directed     Comments:      If you experience chest pain or shortness of breath,  CALL 911 and be transported to the hospital emergency room.  If you develope a fever above 101 F, pus (white drainage) or increased drainage or redness at the  wound, or calf pain, call your surgeon's office.    Constipation Prevention  As directed     Comments:      Drink plenty of fluids.  Prune juice may be helpful.  You may use a stool softener, such as Colace (over the counter) 100 mg twice a day.  Use MiraLax (over the counter) for constipation as needed.    Diet - low sodium heart healthy  As directed     Discharge instructions  As directed     Comments:      Jacqueline KitchenMarland KitchenKeep bandage clean and dry.  Call for any problems.  No smoking.  Criteria for driving a car: you should be off your pain medicine for 7-8 hours, able to drive one handed(confident), thinking clearly and feeling able in your judgement to drive. Continue elevation as it will decrease swelling.  If instructed by MD move your fingers within the confines of the bandage/splint.  Use ice if instructed by your MD. Call immediately for any sudden loss of feeling in your hand/arm or change in functional abilities of the extremity. Jacqueline Lawson.We recommend that you to take vitamin C 1000 mg a day to promote healing we also recommend that if you require her pain medicine that he take a stool softener to prevent constipation as most pain medicines will have constipation side effects. We recommend either Peri-Colace or Senokot and recommend that you also consider adding MiraLAX to prevent the constipation affects from pain medicine if you are required to use them. These medicines are over the counter and maybe purchased at a local pharmacy.    Increase activity slowly as tolerated  As directed     OT splint / sling  As directed 11/29/2013        Medication List    STOP taking these medications       ibuprofen 200 MG tablet  Commonly known as:  ADVIL,MOTRIN      TAKE these medications       methocarbamol 500 MG tablet  Commonly known as:  ROBAXIN  Take 1 tablet (500 mg total) by mouth 3 (three) times daily.     ondansetron 4 MG tablet  Commonly known as:  ZOFRAN  Take 1 tablet (4 mg total) by mouth  every 6 (six) hours as needed for nausea.     oxyCODONE 5 MG immediate release tablet  Commonly known as:  Oxy IR/ROXICODONE  Take 1 tablet (5 mg total) by mouth every 4 (four) hours as needed.           Follow-up Information   Follow up with Karen Chafe, MD On 12/13/2012. (call for any questions or concerns)    Contact information:   [address] [place] [address] [phone]       Signed: Sheran Lawless 11/29/2012, 7:57 AM

## 2012-11-29 NOTE — Progress Notes (Signed)
Patient discharge home in care of family. Discharge instructions given understanding verbalized. NO additional questions at this time. Will continue to monitor.

## 2012-11-29 NOTE — Evaluation (Signed)
Occupational Therapy Evaluation Patient Details Name: Jacqueline Lawson MRN: [id number] DOB: 12-25-1965 Today's Date: 11/29/2012 Time: 4098-1191 OT Time Calculation (min): 11 min  OT Assessment / Plan / Recommendation Clinical Impression  Pt presents to OT s/p elbow fracture in which Dr Amanda Pea performed surgical intervention. OT provided education regarding ADL activity, finger ROM , positioning and elevation to prevent edema.  All education complete    OT Assessment  All further OT needs can be met in the next venue of care (as MD orders OP for pt)    Follow Up Recommendations  Outpatient OT (as MD ready)                Precautions / Restrictions Restrictions Other Position/Activity Restrictions: NWB LUE       ADL  Transfers/Ambulation Related to ADLs: OT provided education tp pt per MD order including finger ROM, arm elevation to prevent edema.  No edema noted in fingers during OT eval. Pts family will provide A as needed    OT Diagnosis: Generalized weakness;Acute pain  OT Problem List: Decreased strength;Decreased range of motion;Decreased activity tolerance      Visit Information  Last OT Received On: 11/29/12       Prior Functioning     Home Living Lives With: Family Available Help at Discharge: Family Type of Home: House Prior Function Comments: Family willl help pt as she needs. Mother present for OT eval         Vision/Perception Vision - History Patient Visual Report: No change from baseline   Cognition  Cognition Arousal/Alertness: Awake/alert Behavior During Therapy: WFL for tasks assessed/performed Overall Cognitive Status: Within Functional Limits for tasks assessed       Mobility Bed Mobility Bed Mobility: Supine to Sit Supine to Sit: 4: Min assist Transfers Transfers: Sit to Stand;Stand to Sit Sit to Stand: 4: Min guard;From bed Stand to Sit: 4: Min guard;To bed           End of Session OT - End of Session Activity  Tolerance: Patient tolerated treatment well Patient left: in bed;with call bell/phone within reach;with family/visitor present  GO     Jacqueline Lawson, [place] Kung 11/29/2012, 8:57 AM

## 2012-11-30 ENCOUNTER — Encounter (HOSPITAL_COMMUNITY): Payer: Self-pay | Admitting: Orthopedic Surgery

## 2012-12-06 NOTE — ED Provider Notes (Signed)
Medical screening examination/treatment/procedure(s) were conducted as a shared visit with non-physician practitioner(s) and myself.  I personally evaluated the patient during the encounter  Toy Baker, MD 12/06/12 [phone]

## 2012-12-29 ENCOUNTER — Ambulatory Visit: Payer: Self-pay | Attending: Orthopedic Surgery | Admitting: Occupational Therapy

## 2012-12-29 DIAGNOSIS — IMO0001 Reserved for inherently not codable concepts without codable children: Secondary | ICD-10-CM | POA: Insufficient documentation

## 2012-12-29 DIAGNOSIS — M2550 Pain in unspecified joint: Secondary | ICD-10-CM | POA: Insufficient documentation

## 2012-12-29 DIAGNOSIS — M2560 Stiffness of unspecified joint, not elsewhere classified: Secondary | ICD-10-CM | POA: Insufficient documentation

## 2013-01-05 ENCOUNTER — Ambulatory Visit: Payer: Self-pay | Admitting: Occupational Therapy

## 2013-01-11 ENCOUNTER — Ambulatory Visit: Payer: Self-pay | Attending: Orthopedic Surgery | Admitting: Occupational Therapy

## 2013-01-11 DIAGNOSIS — M2550 Pain in unspecified joint: Secondary | ICD-10-CM | POA: Insufficient documentation

## 2013-01-11 DIAGNOSIS — IMO0001 Reserved for inherently not codable concepts without codable children: Secondary | ICD-10-CM | POA: Insufficient documentation

## 2013-01-11 DIAGNOSIS — M2560 Stiffness of unspecified joint, not elsewhere classified: Secondary | ICD-10-CM | POA: Insufficient documentation

## 2013-01-19 ENCOUNTER — Ambulatory Visit: Payer: Self-pay | Admitting: Occupational Therapy

## 2013-01-26 ENCOUNTER — Ambulatory Visit: Payer: Self-pay | Admitting: Occupational Therapy

## 2013-02-02 ENCOUNTER — Ambulatory Visit: Payer: Self-pay | Admitting: Occupational Therapy

## 2013-02-09 ENCOUNTER — Ambulatory Visit: Payer: Self-pay | Attending: Orthopedic Surgery | Admitting: Occupational Therapy

## 2013-02-09 DIAGNOSIS — M2550 Pain in unspecified joint: Secondary | ICD-10-CM | POA: Insufficient documentation

## 2013-02-09 DIAGNOSIS — M2560 Stiffness of unspecified joint, not elsewhere classified: Secondary | ICD-10-CM | POA: Insufficient documentation

## 2013-02-09 DIAGNOSIS — IMO0001 Reserved for inherently not codable concepts without codable children: Secondary | ICD-10-CM | POA: Insufficient documentation

## 2013-02-15 ENCOUNTER — Ambulatory Visit: Payer: Self-pay | Admitting: Occupational Therapy

## 2013-02-16 ENCOUNTER — Encounter: Payer: Self-pay | Admitting: Occupational Therapy

## 2013-02-23 ENCOUNTER — Ambulatory Visit: Payer: Self-pay | Admitting: Occupational Therapy

## 2014-08-15 IMAGING — CR DG ELBOW COMPLETE 3+V*L*
3 series · 3 of 3 positions shown · non-contrast
Comparison: None

CLINICAL DATA: Left elbow pain and limited mobility post fall

LEFT ELBOW - COMPLETE 3+ VIEW

[x elbow ap left]
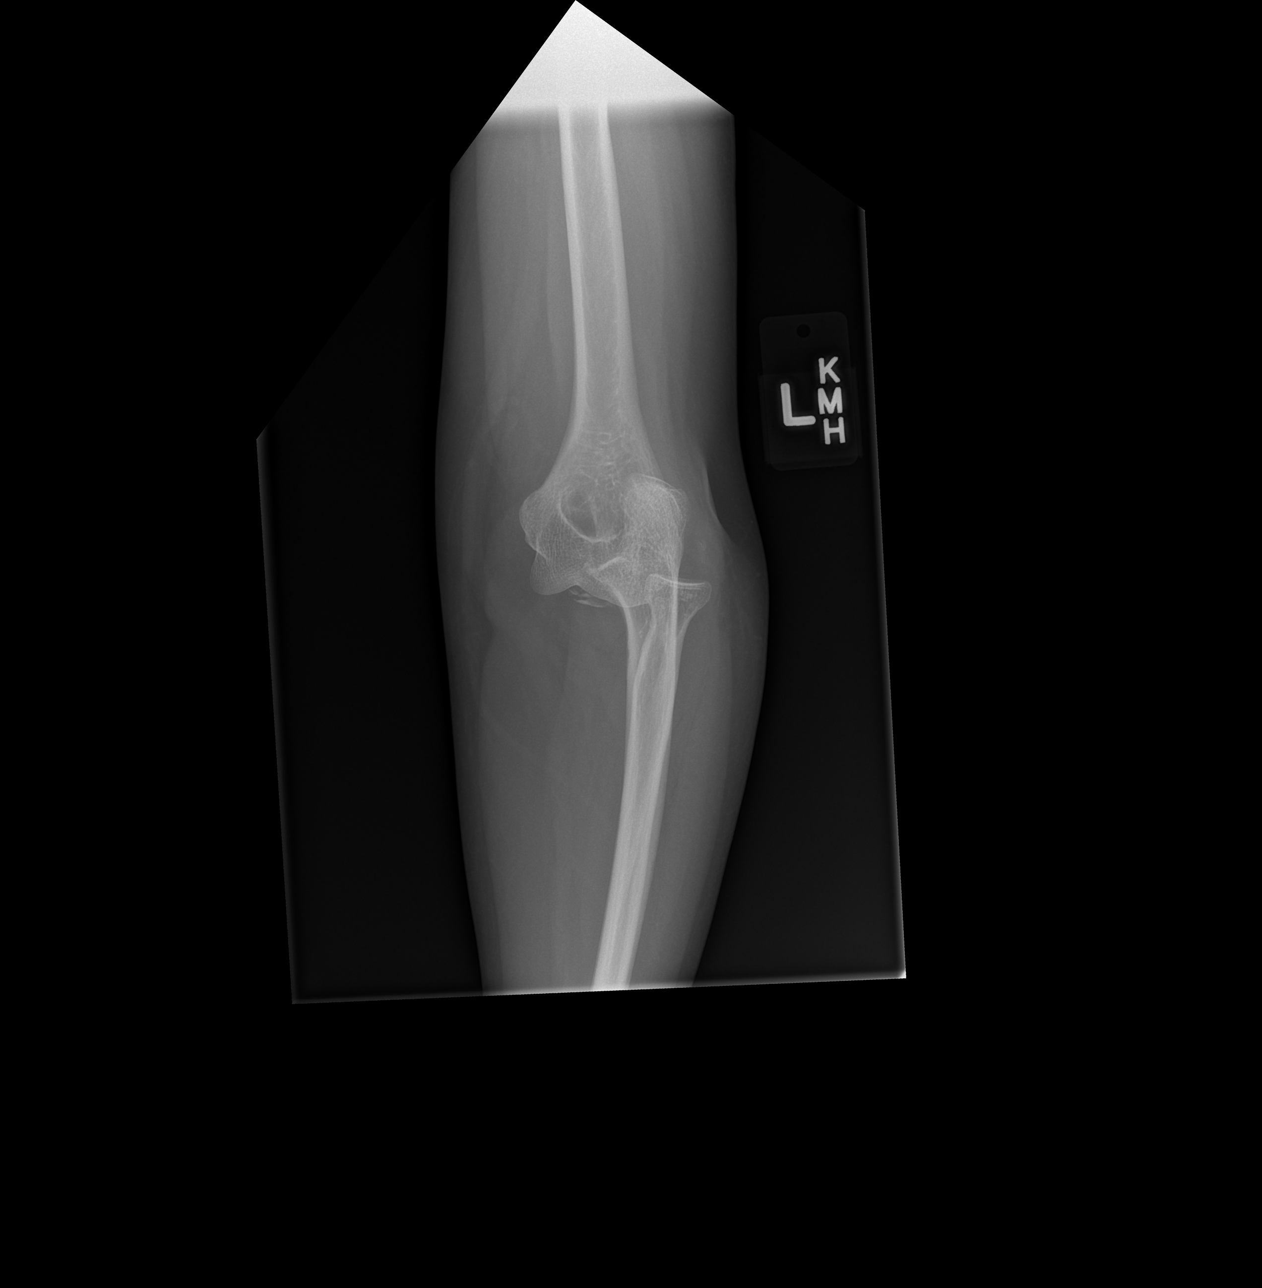

[x elbow obl left (1 of 2)]
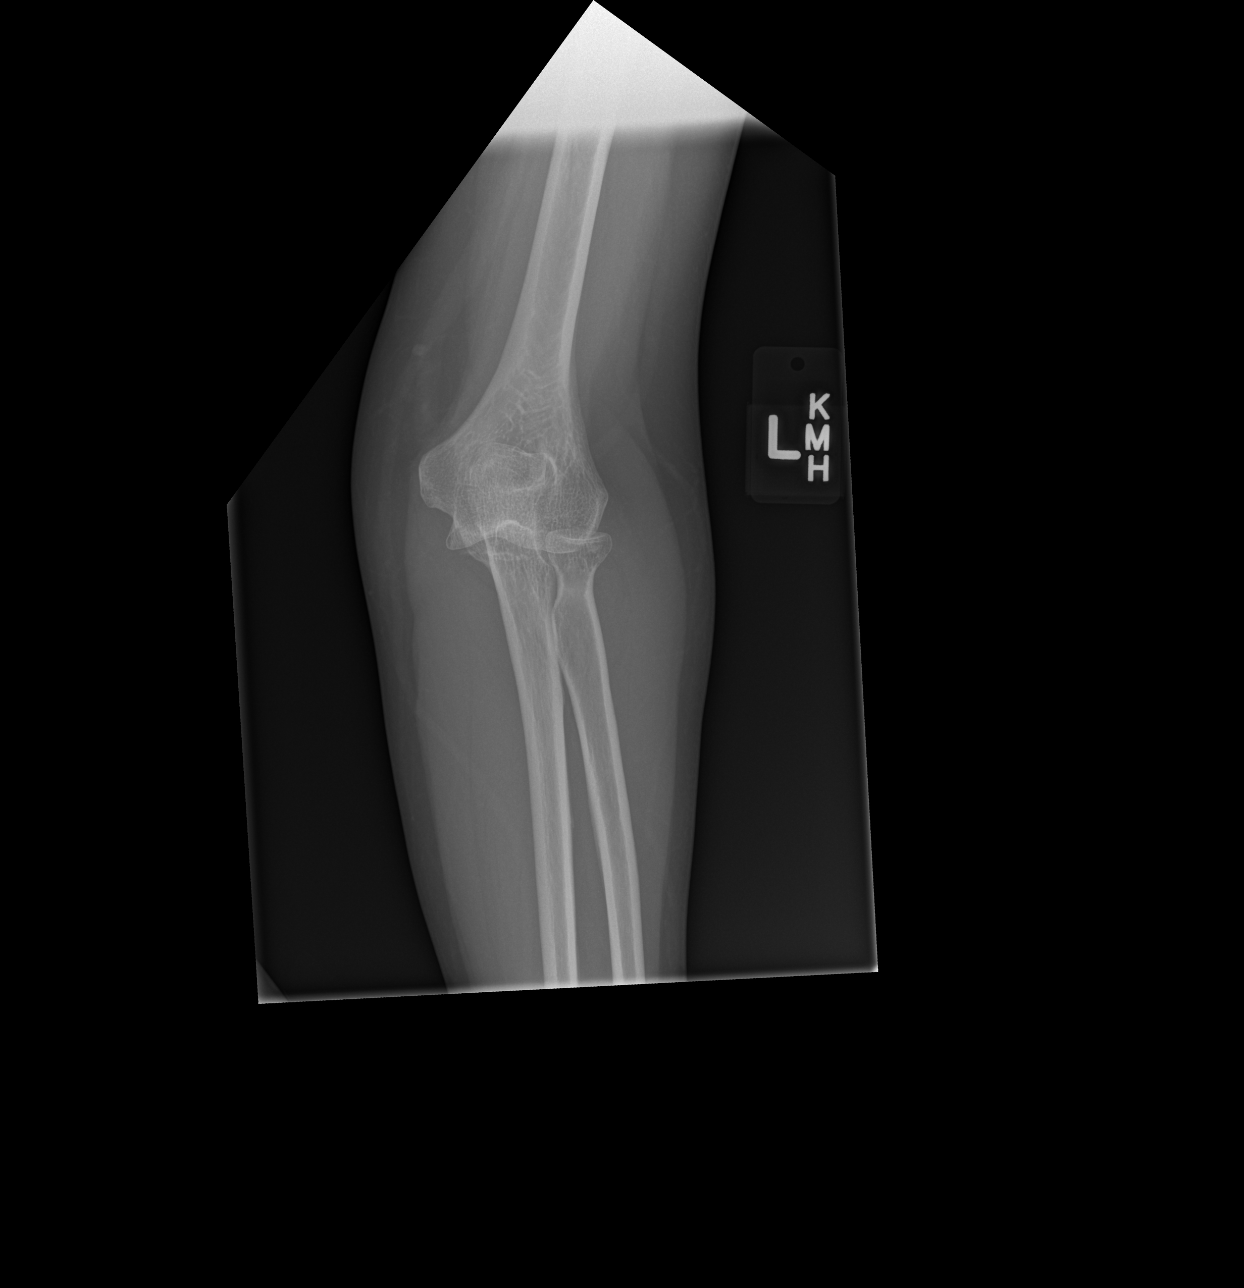

[x elbow obl left (2 of 2)]
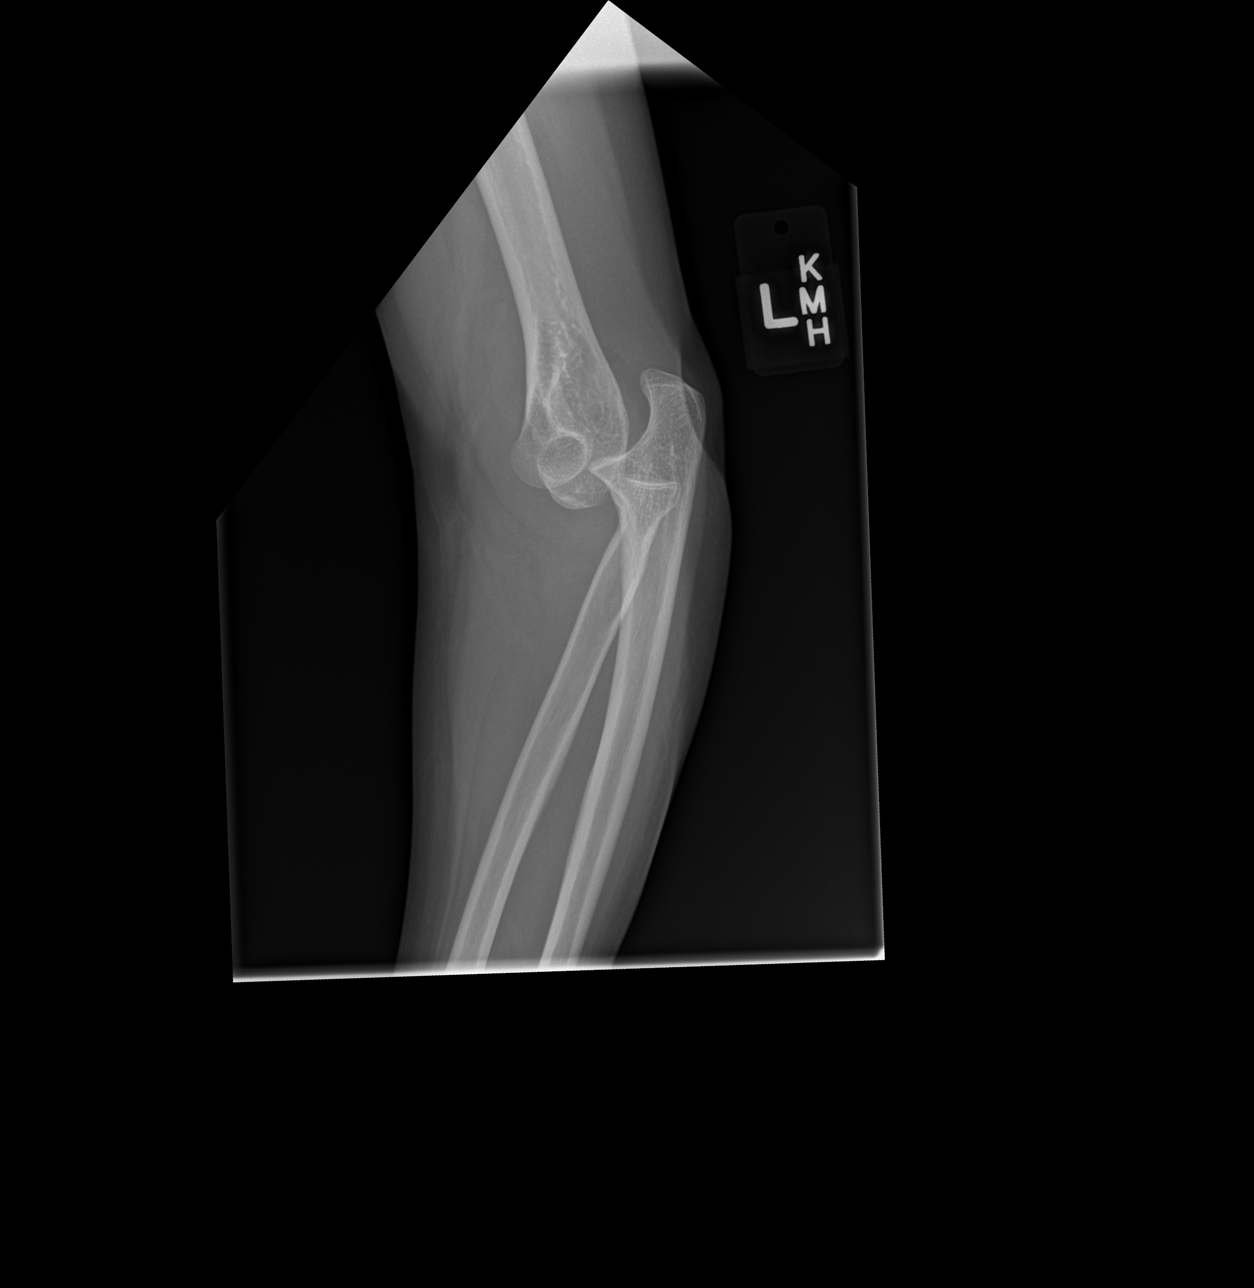

[3 of 3 positions shown; findings below may reference images not displayed]

FINDINGS: Posterior/dorsal dislocation of the left elbow.
Multiple small fracture fragments are seen at the trochlea, of
uncertain origin.
Associated soft tissue swelling and deformity as well as
questionable elbow joint effusion.
IMPRESSION: Posterior/dorsal elbow dislocation with a few linear fracture
fragments at the trochlea of uncertain origin.

## 2014-08-15 IMAGING — CR DG ELBOW 2V*L*
2 series · 2 of 2 positions shown · non-contrast
Comparison: 11/28/2012

CLINICAL DATA: Elbow dislocation, post reduction

LEFT ELBOW - 2 VIEW

[x elbow ap left (1 of 2)]
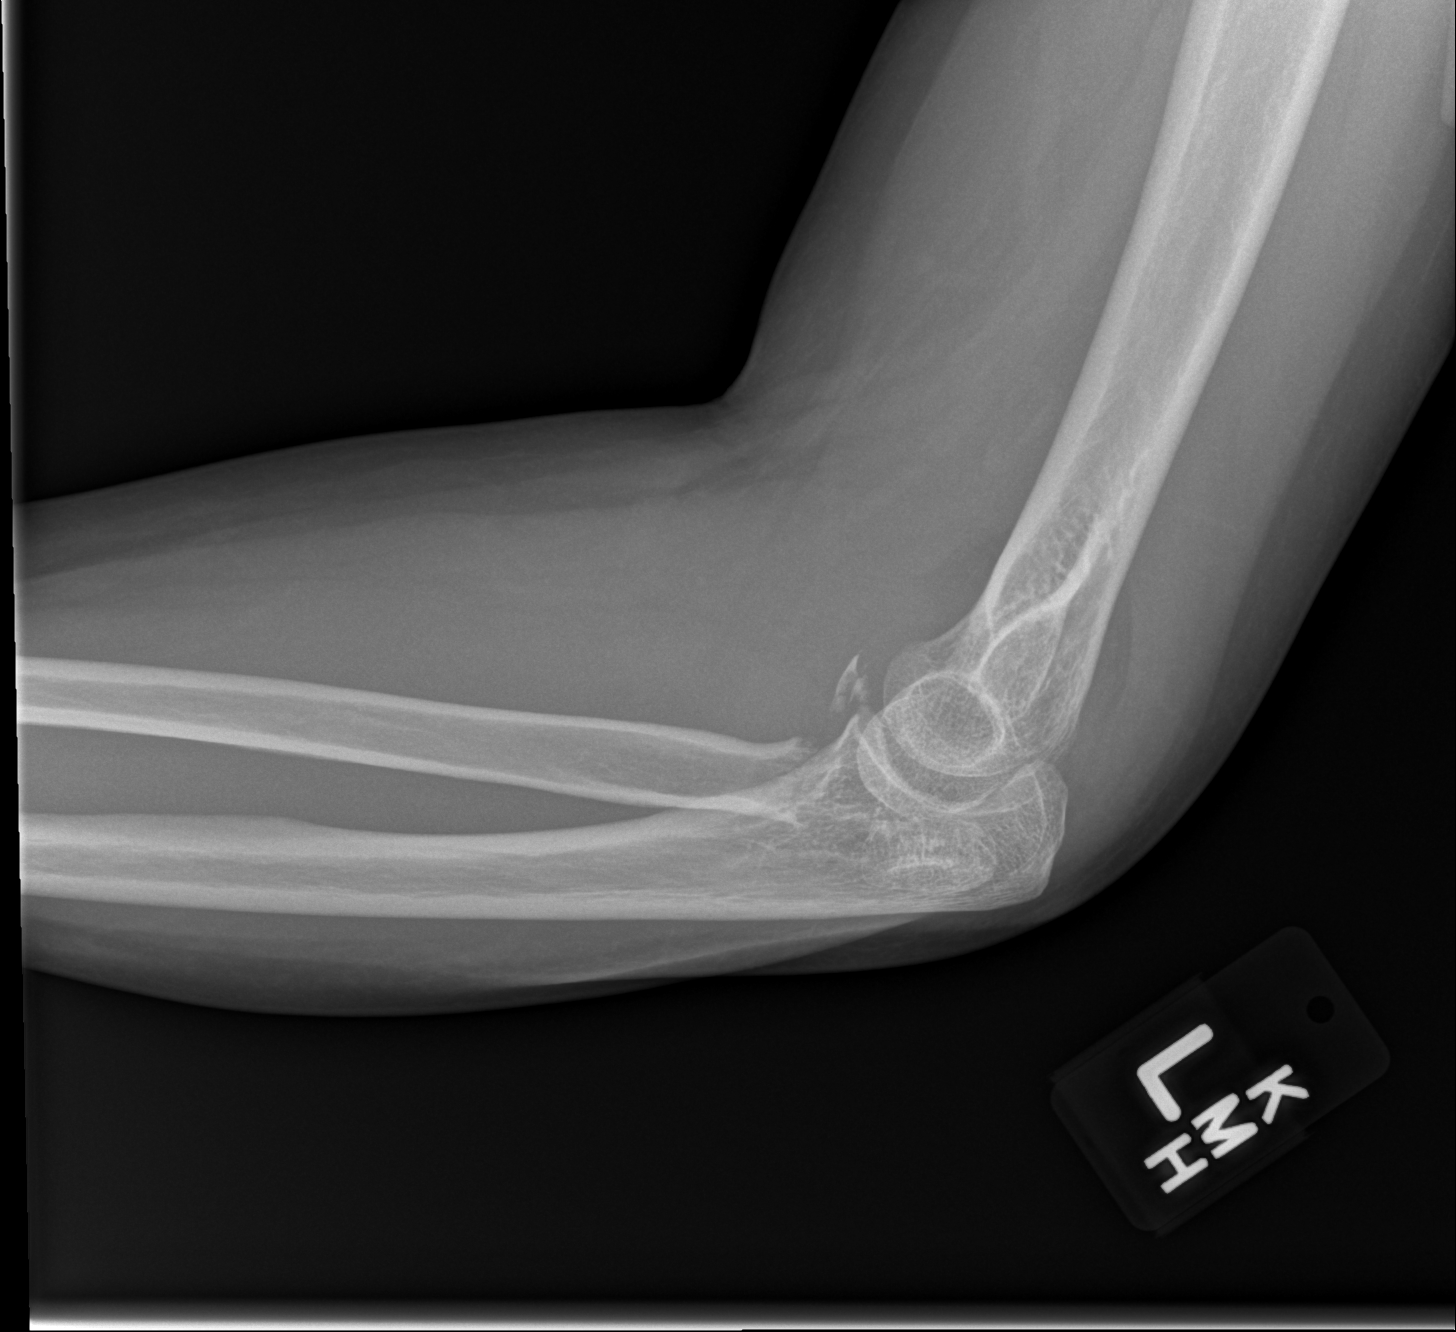

[x elbow ap left (2 of 2)]
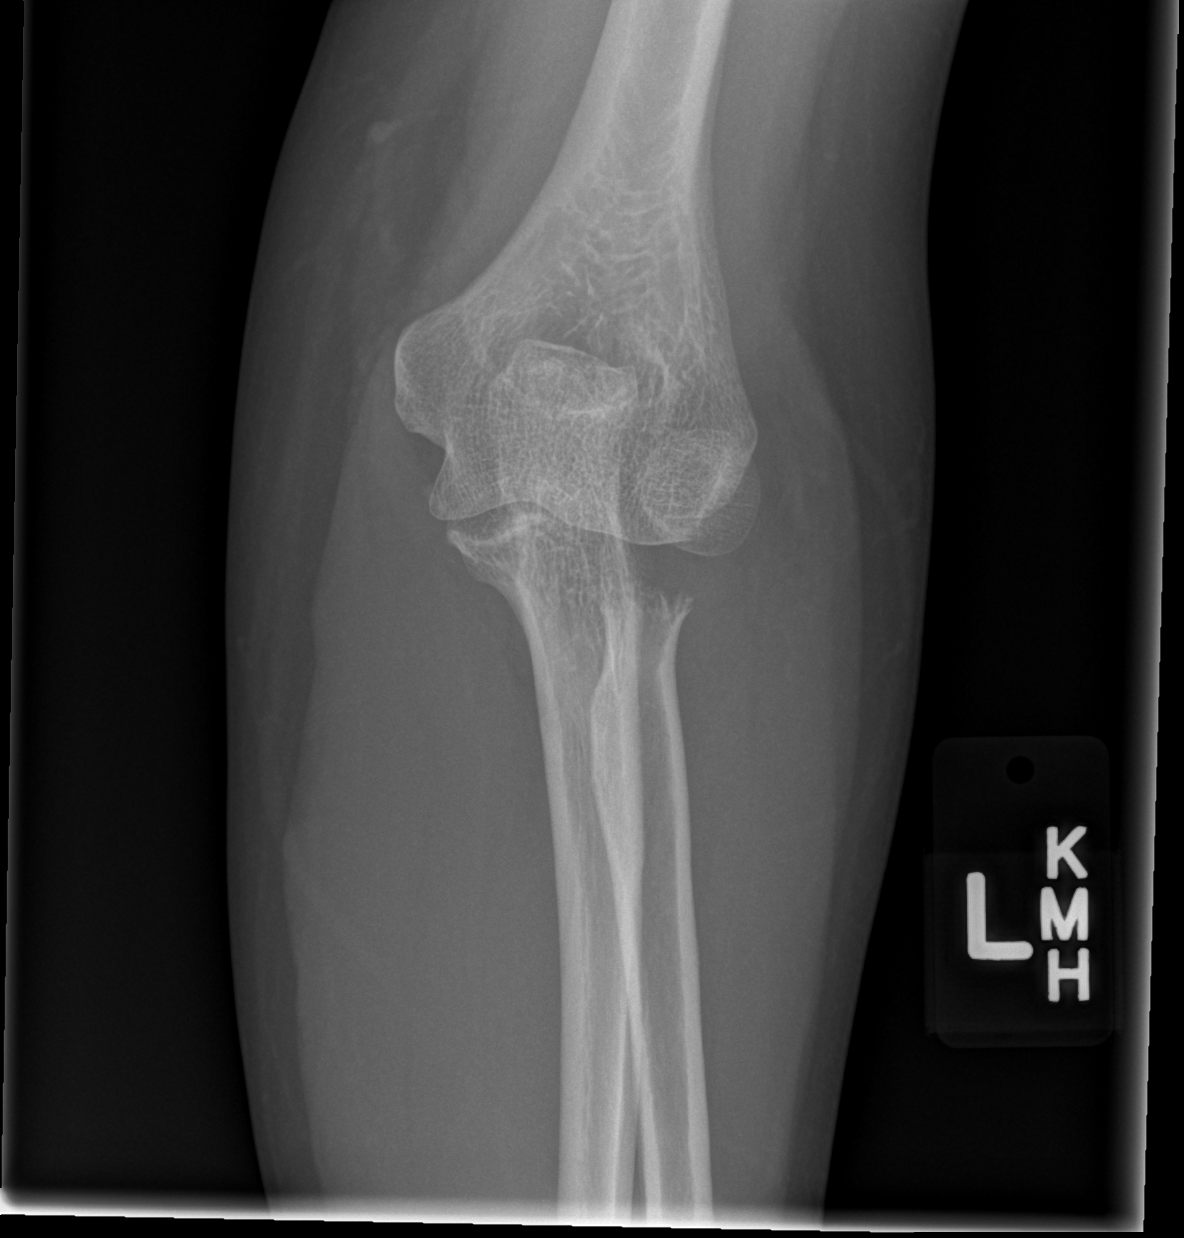

[2 of 2 positions shown; findings below may reference images not displayed]

FINDINGS: Trochlear joint dislocation has been reduced with normal alignment
of ulna and humerus now identified.
Displaced radial neck fracture is now identified.
Radial head fragment is dislocated from the radiocapitellar joint.
Bone fragments are seen anterior to the elbow joint.
Large elbow joint effusion is seen.
Regional soft tissue swelling.
IMPRESSION: Reduction of previously identified ulnar dislocation with normal
alignment of the trochlear joint now identified.
Displaced radial neck fracture with dislocation of the radial head
from the radiocapitellar joint.
This radial neck fracture is not well visualized on the earlier
radiographs.

## 2019-09-04 ENCOUNTER — Ambulatory Visit: Payer: Self-pay | Attending: Internal Medicine

## 2019-09-04 DIAGNOSIS — Z23 Encounter for immunization: Secondary | ICD-10-CM

## 2019-09-04 NOTE — Progress Notes (Signed)
   Covid-19 Vaccination Clinic  Name:  Marlaysia Lenig    MRN: [id number] DOB: [birth year]  09/04/2019  Ms. Loper was observed post Covid-19 immunization for 15 minutes without incident. She was provided with Vaccine Information Sheet and instruction to access the V-Safe system.   Ms. Piggott was instructed to call 911 with any severe reactions post vaccine: [name] Difficulty breathing  . Swelling of face and throat  . A fast heartbeat  . A bad rash all over body  . Dizziness and weakness   Immunizations Administered    Name Date Dose VIS Date Route   Pfizer COVID-19 Vaccine 09/04/2019  9:23 AM 0.3 mL 05/20/2019 Intramuscular   Manufacturer: [employer], [employer]   Lot: [id number]   NDC: [address]-1

## 2019-09-25 ENCOUNTER — Ambulatory Visit: Payer: Self-pay | Attending: Internal Medicine

## 2019-09-25 DIAGNOSIS — Z23 Encounter for immunization: Secondary | ICD-10-CM

## 2019-09-25 NOTE — Progress Notes (Signed)
   Covid-19 Vaccination Clinic  Name:  Jaquelinne Glendening    MRN: [id number] DOB: 03-Jun-1966  09/25/2019  Ms. Zweig was observed post Covid-19 immunization for 15 minutes without incident. She was provided with Vaccine Information Sheet and instruction to access the V-Safe system.   Ms. Nikolic was instructed to call 911 with any severe reactions post vaccine: [name] Difficulty breathing  . Swelling of face and throat  . A fast heartbeat  . A bad rash all over body  . Dizziness and weakness   Immunizations Administered    Name Date Dose VIS Date Route   Pfizer COVID-19 Vaccine 09/25/2019  9:58 AM 0.3 mL 05/20/2019 Intramuscular   Manufacturer: [employer], [employer]   Lot: [id number]   NDC: [address]-4
